# Patient Record
Sex: Male | Born: 1995 | Race: White | Hispanic: No | Smoking: Never smoker
Health system: Southern US, Community
[De-identification: ages and names within clinical notes are randomized; demographics above are authoritative.]

## PROBLEM LIST (undated history)

## (undated) HISTORY — PX: VASECTOMY: SHX75

---
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
## 2014-10-01 DIAGNOSIS — Z0000 Encounter for general adult medical examination without abnormal findings: Secondary | ICD-10-CM | POA: Insufficient documentation

## 2021-12-09 ENCOUNTER — Ambulatory Visit (INDEPENDENT_AMBULATORY_CARE_PROVIDER_SITE_OTHER): Payer: 59 | Admitting: Urology

## 2021-12-09 ENCOUNTER — Encounter: Payer: Self-pay | Admitting: Urology

## 2021-12-09 ENCOUNTER — Ambulatory Visit: Payer: Self-pay | Admitting: Urology

## 2021-12-09 VITALS — BP 128/84 | HR 66 | Ht 70.0 in | Wt 198.0 lb

## 2021-12-09 DIAGNOSIS — Z3009 Encounter for other general counseling and advice on contraception: Secondary | ICD-10-CM

## 2021-12-09 NOTE — Progress Notes (Signed)
   12/09/21 3:12 PM   Cameron Porter 12/17/1995 [id number]  CC: Discuss vasectomy  HPI: Healthy 26 year old male with 3 children interested in vasectomy for permanent sterilization.  He and his wife do not desire any further biologic pregnancies.  He denies any urinary symptoms.   Social History:  reports that he has never smoked. He has never been exposed to tobacco smoke. He has never used smokeless tobacco. He reports current alcohol use. He reports that he does not use drugs.  Physical Exam: BP 128/84   Pulse 66   Ht 5\' 10"  (1.778 m)   Wt 198 lb (89.8 kg)   BMI 28.41 kg/m    Constitutional:  Alert and oriented, No acute distress. Cardiovascular: No clubbing, cyanosis, or edema. Respiratory: Normal respiratory effort, no increased work of breathing. GI: Abdomen is soft, nontender, nondistended, no abdominal masses GU: vas deferens easily palpable bilaterally, no testicular masses, small left epididymal cyst  Assessment & Plan:   26 year old male interested in vasectomy for permanent sterilization.  We discussed the risks and benefits of vasectomy at length.  Vasectomy is intended to be a permanent form of contraception, and does not produce immediate sterility.  Following vasectomy another form of contraception is required until vas occlusion is confirmed by a post-vasectomy semen analysis obtained 2-3 months after the procedure.  Even after vas occlusion is confirmed, vasectomy is not 100% reliable in preventing pregnancy, and the failure rate is approximately 02/1998.  Repeat vasectomy is required in less than 1% of patients.  He should refrain from ejaculation for 1 week after vasectomy.  Options for fertility after vasectomy include vasectomy reversal, and sperm retrieval with in vitro fertilization or ICSI.  These options are not always successful and may be expensive.  Finally, there are other permanent and non-permanent alternatives to vasectomy available. There is no risk of  erectile dysfunction, and the volume of semen will be similar to prior, as the majority of the ejaculate is from the prostate and seminal vesicles.   The procedure takes ~20 minutes.  We recommend patients take 5-10 mg of Valium 30 minutes prior, and he will need a driver post-procedure.  Local anesthetic is injected into the scrotal skin and a small segment of the vas deferens is removed, and the ends occluded. The complication rate is approximately 1-2%, and includes bleeding, infection, and development of chronic scrotal pain.  PLAN: Schedule vasectomy   Cameron Madrid, MD 12/09/2021  [facility] [address] [place], [place] [address] [phone]

## 2021-12-09 NOTE — Patient Instructions (Signed)

## 2022-01-06 ENCOUNTER — Encounter: Payer: Self-pay | Admitting: Urology

## 2022-01-06 ENCOUNTER — Ambulatory Visit (INDEPENDENT_AMBULATORY_CARE_PROVIDER_SITE_OTHER): Payer: 59 | Admitting: Urology

## 2022-01-06 DIAGNOSIS — Z302 Encounter for sterilization: Secondary | ICD-10-CM

## 2022-01-06 NOTE — Progress Notes (Signed)
VASECTOMY PROCEDURE NOTE:  The patient was taken to the minor procedure room and placed in the supine position. His genitals were prepped and draped in the usual sterile fashion. The right vas deferens was brought up to the skin of the right upper scrotum. The skin overlying it was anesthetized with 1% lidocaine without epinephrine, anesthetic was also injected alongside the vas deferens in the direction of the inguinal canal. The no scalpel vasectomy instrument was used to make a small perforation in the scrotal skin. The vasectomy clamp was used to grasp the vas deferens. It was carefully dissected free from surrounding structures. A 1cm segment of the vas was removed, and the cut ends of the mucosa were cauterized. The vas deferens was returned to the scrotum. The skin incision was closed with a simple interrupted stitch of 4-0 chromic.  Attention was then turned to the left side. The left vasectomy was performed in the same exact fashion. Sterile dressings were placed over each incision. The patient tolerated the procedure well.  IMPRESSION/DIAGNOSIS: The patient is a 26 year old gentleman who underwent a vasectomy today. Post-procedure instructions were reviewed. I stressed the importance of continuing to use birth control until he provides a semen specimen more than 2 months from now that demonstrates azoospermia.  We discussed return precautions including fever over 101, significant bleeding or hematoma, or uncontrolled pain. I also stressed the importance of avoiding strenuous activity for one week, no sexual activity or ejaculations for 5 days, intermittent icing over the next 48 hours, and scrotal support.   PLAN: The patient will be advised of his semen analysis results when available.  Legrand Rams, MD 01/06/2022

## 2022-01-06 NOTE — Patient Instructions (Signed)
Vasectomy, Care After This sheet gives you information about how to care for yourself after your procedure. Your health care provider may also give you more specific instructions. If you have problems or questions, contact your health care provider. What can I expect after the procedure? After the procedure, it is common to have: Mild pain, swelling, or discomfort in your scrotum or redness on your scrotum. Some blood coming from your incisions or puncture sites for 1 or 2 days. Blood in your semen. Follow these instructions at home: Medicines Take over-the-counter and prescription medicines only as told by your health care provider. Avoid taking any medicines that contain aspirin or NSAIDs, such as ibuprofen. These medicines can make bleeding worse. Activity For the first 2 days after surgery, avoid physical activity and exercise that requires a lot of energy. Ask your health care provider what activities are safe for you. Do not take part in sports or perform heavy physical labor until your pain has improved, or until your health care provider says it is okay. You may have limits on the amount of weight you can lift as told by your health care provider. Do not ejaculate for at least 1 week after the procedure, or for as long as you are told. You may resume sexual activity 7-10 days after your procedure, or when your health care provider approves. Use a different method of birth control (contraception) until you have had test results that confirm that there is no sperm in your semen. Scrotal support Use scrotal support, such as a jockstrap or underwear with a supportive pouch, as needed for 1 week after your procedure. If you feel discomfort in your scrotum, you may remove the scrotal support to see if the discomfort is relieved. Sometimes scrotal support can press on the scrotum and cause or worsen discomfort. If your skin gets irritated, you may add some germ-free (sterile), fluffed bandages  or a clean washcloth to the scrotal support. Managing pain and swelling If directed, put ice on the affected area. To do this: Put ice in a plastic bag. Place a towel between your skin and the bag. Leave the ice on for 20 minutes, 2-3 times a day. Remove the ice if your skin turns bright red. This is very important. If you cannot feel pain, heat, or cold, you have a greater risk of damage to the area.  General instructions  Check your incisions or puncture sites every day for signs of infection. Check for: Redness, swelling, or more pain. Fluid or blood. Warmth. Pus or a bad smell. Leave stitches (sutures) in place. The sutures will dissolve on their own and do not need to be removed. Keep all follow-up visits. This is important because you will need a test to confirm that there is no sperm in your semen. Multiple ejaculations are needed to clear out sperm that were beyond the vasectomy site. You will need one test result showing that there is no sperm in your semen before you can resume unprotected sex. This may take 2-4 months after your procedure. If you were given a sedative during the procedure, it can affect you for several hours. Do not drive or operate machinery until your health care provider says that it is safe. Contact a health care provider if: You have redness, swelling, or more pain around an incision or puncture site, or in your scrotum area. You have bleeding from an incision or puncture site. You have pus or a bad smell coming from an incision   or puncture site. You have a fever. An incision or puncture site opens up. Get help right away if: You develop a rash. You have trouble breathing. Summary After your procedure, it is common to have mild pain, swelling, redness, or discomfort in your scrotum. For the first 2 days after surgery, avoid physical activity and exercise that requires a lot of energy. Put ice on the affected area. Leave the ice on for 20 minutes, 2-3  times a day. If you were given a sedative during the procedure, it can affect you for several hours. Do not drive or operate machinery until your health care provider says that it is safe. This information is not intended to replace advice given to you by your health care provider. Make sure you discuss any questions you have with your health care provider. Document Revised: 06/15/2019 Document Reviewed: 06/15/2019 Elsevier Patient Education  2023 [employer].

## 2022-02-10 ENCOUNTER — Ambulatory Visit: Payer: 59 | Admitting: Urology

## 2022-02-12 ENCOUNTER — Ambulatory Visit: Payer: 59 | Admitting: Urology

## 2022-02-13 ENCOUNTER — Encounter: Payer: Self-pay | Admitting: Urology

## 2022-04-08 ENCOUNTER — Other Ambulatory Visit: Payer: 59

## 2022-04-08 DIAGNOSIS — Z302 Encounter for sterilization: Secondary | ICD-10-CM

## 2022-04-09 LAB — SEMEN ANALYSIS, BASIC

## 2022-06-02 NOTE — Addendum Note (Signed)
Addended by: Consuella Lose on: 06/02/2022 01:26 PM   Modules accepted: Orders
